# Patient Record
Sex: Female | Born: 1990 | Race: Black or African American | Hispanic: No | Marital: Single | Smoking: Never smoker
Health system: Southern US, Community
[De-identification: ages and names within clinical notes are randomized; demographics above are authoritative.]

---

## 2016-03-16 ENCOUNTER — Encounter (HOSPITAL_COMMUNITY): Payer: Self-pay | Admitting: Emergency Medicine

## 2016-03-16 ENCOUNTER — Emergency Department (HOSPITAL_COMMUNITY)
Admission: EM | Admit: 2016-03-16 | Discharge: 2016-03-16 | Disposition: A | Discharge status: Home / Self Care | Payer: Medicaid Other | Attending: Emergency Medicine | Admitting: Emergency Medicine

## 2016-03-16 DIAGNOSIS — T490X5A Adverse effect of local antifungal, anti-infective and anti-inflammatory drugs, initial encounter: Secondary | ICD-10-CM | POA: Insufficient documentation

## 2016-03-16 DIAGNOSIS — R1909 Other intra-abdominal and pelvic swelling, mass and lump: Secondary | ICD-10-CM | POA: Diagnosis present

## 2016-03-16 DIAGNOSIS — L233 Allergic contact dermatitis due to drugs in contact with skin: Secondary | ICD-10-CM | POA: Insufficient documentation

## 2016-03-16 MED ORDER — PREDNISONE 50 MG PO TABS: ORAL_TABLET | ORAL | 0 refills | Status: AC

## 2016-03-16 NOTE — ED Triage Notes (Signed)
Pt complaint of vaginal/genitalia swelling post home treatment for yeast infection 3 days ago.

## 2016-03-16 NOTE — ED Provider Notes (Signed)
[facility]-EMERGENCY DEPT Provider Note   CSN: [id number] Arrival date & time: 03/16/16  [id number]     History   Chief Complaint Chief Complaint  Patient presents with  . Groin Swelling    HPI Stacie Hamilton is a 26 y.o. female.  26 year old female presents with labial irritation after using Monistat. States that she has not had any vaginal discharge or bleeding. Denies any dysuria or hematuria. No lower abdominal discomfort. She is not concerned about STDs. States that her yeast infection has improved but now she has discomfort when she ambulates. Denies any pruritus. Has not used anything prior to arrival.      History reviewed. No pertinent past medical history.  There are no active problems to display for this patient.   Past Surgical History:  Procedure Laterality Date  . CESAREAN SECTION      OB History    No data available       Home Medications    Prior to Admission medications   Not on File    Family History No family history on file.  Social History Social History  Substance Use Topics  . Smoking status: Never Smoker  . Smokeless tobacco: Not on file  . Alcohol use No     Allergies   Patient has no known allergies.   Review of Systems Review of Systems  All other systems reviewed and are negative.    Physical Exam Updated Vital Signs BP 129/90 (BP Location: Right Arm)   Pulse 77   Temp 98.2 F (36.8 C) (Oral)   Resp 14   Ht 5\' 1"  (1.549 m)   Wt 70.3 kg   LMP 02/23/2016   SpO2 100%   BMI 29.29 kg/m   Physical Exam  Constitutional: She is oriented to person, place, and time. She appears well-developed and well-nourished.  Non-toxic appearance. No distress.  HENT:  Head: Normocephalic and atraumatic.  Eyes: Conjunctivae, EOM and lids are normal. Pupils are equal, round, and reactive to light.  Neck: Normal range of motion. Neck supple. No tracheal deviation present. No thyroid mass present.  Cardiovascular: Normal rate, regular  rhythm and normal heart sounds.  Exam reveals no gallop.   No murmur heard. Pulmonary/Chest: Effort normal and breath sounds normal. No stridor. No respiratory distress. She has no decreased breath sounds. She has no wheezes. She has no rhonchi. She has no rales.  Abdominal: Soft. Normal appearance and bowel sounds are normal. She exhibits no distension. There is no tenderness. There is no rebound and no CVA tenderness.  Genitourinary: There is no rash, lesion or injury on the right labia. There is no rash, lesion or injury on the left labia.  Musculoskeletal: Normal range of motion. She exhibits no edema or tenderness.  Neurological: She is alert and oriented to person, place, and time. She has normal strength. No cranial nerve deficit or sensory deficit. GCS eye subscore is 4. GCS verbal subscore is 5. GCS motor subscore is 6.  Skin: Skin is warm and dry. No abrasion and no rash noted.  Psychiatric: She has a normal mood and affect. Her speech is normal and behavior is normal.  Nursing note and vitals reviewed.    ED Treatments / Results  Labs (all labs ordered are listed, but only abnormal results are displayed) Labs Reviewed - No data to display  EKG  EKG Interpretation None       Radiology No results found.  Procedures Procedures (including critical care time)  Medications Ordered in  ED Medications - No data to display   Initial Impression / Assessment and Plan / ED Course  I have reviewed the triage vital signs and the nursing notes.  Pertinent labs & imaging results that were available during my care of the patient were reviewed by me and considered in my medical decision making (see chart for details).  Clinical Course     Patient has deferred a vaginal exam at this time. Does have some slight edema to her labia. Acute course of prednisone and return precautions given  Final Clinical Impressions(s) / ED Diagnoses   Final diagnoses:  None    New  Prescriptions New Prescriptions   No medications on file     Lorre Nick, MD 03/16/16 1040

## 2016-03-16 NOTE — Discharge Instructions (Signed)
Use Benadryl if you develop any itching.

## 2016-05-11 ENCOUNTER — Ambulatory Visit (HOSPITAL_COMMUNITY)
Admission: EM | Admit: 2016-05-11 | Discharge: 2016-05-11 | Disposition: A | Discharge status: Home / Self Care | Payer: Self-pay | Attending: Internal Medicine | Admitting: Internal Medicine

## 2016-05-11 ENCOUNTER — Encounter (HOSPITAL_COMMUNITY): Payer: Self-pay | Admitting: Emergency Medicine

## 2016-05-11 DIAGNOSIS — N750 Cyst of Bartholin's gland: Secondary | ICD-10-CM

## 2016-05-11 LAB — POCT URINALYSIS DIP (DEVICE)
Bilirubin Urine: NEGATIVE
Glucose, UA: NEGATIVE mg/dL
KETONES UR: NEGATIVE mg/dL
Leukocytes, UA: NEGATIVE
Nitrite: NEGATIVE
PH: 7 (ref 5.0–8.0)
PROTEIN: NEGATIVE mg/dL
SPECIFIC GRAVITY, URINE: 1.01 (ref 1.005–1.030)
Urobilinogen, UA: 0.2 mg/dL (ref 0.0–1.0)

## 2016-05-11 MED ORDER — HYDROCODONE-ACETAMINOPHEN 5-325 MG PO TABS: 1.0000 | ORAL_TABLET | ORAL | 0 refills | Status: AC | PRN

## 2016-05-11 MED ORDER — LIDOCAINE-EPINEPHRINE (PF) 2 %-1:200000 IJ SOLN
INTRAMUSCULAR | Status: AC
  Filled 2016-05-11: qty 20

## 2016-05-11 NOTE — ED Triage Notes (Signed)
PT reports burning with urination that started today. PT also reports left side vaginal swelling that started Wednesday. PT denies vaginal discharge. PT reports menstrual cycle started today.

## 2016-05-11 NOTE — Discharge Instructions (Signed)
This was an infected cyst full of pus and what is called a Bartholin's gland. This gland was drained and packing was placed. Warm compresses may help. There may be some bleeding. Distal some pressure to the dressing or you may even change the dressing or add more to it. Try to keep it is dry as possible. Try to keep the packing in if possible. Return in 2 days.

## 2016-05-11 NOTE — ED Provider Notes (Signed)
CSN: [id number]     Arrival date & time 05/11/16  1717 History   First MD Initiated Contact with Patient 05/11/16 1821     Chief Complaint  Patient presents with  . Urinary Tract Infection   (Consider location/radiation/quality/duration/timing/severity/associated sxs/prior Treatment) 26 year old female states that she has a swollen gland at the outside of her vagina this started 3 days ago and is getting larger. She started to have some dysuria today she describes more of an irritation. No urinary frequency or urgency.      History reviewed. No pertinent past medical history. Past Surgical History:  Procedure Laterality Date  . CESAREAN SECTION     No family history on file. Social History  Substance Use Topics  . Smoking status: Never Smoker  . Smokeless tobacco: Never Used  . Alcohol use No   OB History    No data available     Review of Systems  Constitutional: Negative.   Genitourinary: Positive for dysuria and genital sores. Negative for flank pain, frequency and urgency.  Skin: Negative.   Neurological: Negative.   All other systems reviewed and are negative.   Allergies  Patient has no known allergies.  Home Medications   Prior to Admission medications   Medication Sig Start Date End Date Taking? Authorizing Provider  predniSONE (DELTASONE) 50 MG tablet One by mouth daily 5 days 03/16/16   Lorre Nick, MD   Meds Ordered and Administered this Visit  Medications - No data to display  BP 140/82 (BP Location: Left Arm)   Pulse 97   Temp 98.3 F (36.8 C) (Oral)   Resp 14   Ht 5\' 1"  (1.549 m)   Wt 155 lb (70.3 kg)   LMP 05/11/2016   SpO2 98%   BMI 29.29 kg/m  No data found.   Physical Exam  Constitutional: She is oriented to person, place, and time. She appears well-developed and well-nourished. No distress.  Eyes: EOM are normal.  Neck: Normal range of motion. Neck supple.  Cardiovascular: Normal rate.   Pulmonary/Chest: Effort normal. No  respiratory distress.  Genitourinary:  Genitourinary Comments: External female genitalia with left bulging cystic lesion consistent with Bartholin's gland cyst. Otherwise no evidence of vaginal discharge. Urethra is without erythema or swelling. No other erythema is seen. No cellulitis.  Musculoskeletal: She exhibits no edema.  Neurological: She is alert and oriented to person, place, and time. She exhibits normal muscle tone.  Skin: Skin is warm and dry.  Psychiatric: She has a normal mood and affect.  Nursing note and vitals reviewed.   Urgent Care Course   assistance from [place], [place] The Valentino Saxon gland was prepped with Betadine. A small amount of 2% lidocaine with epinephrine was injected to the outer lining of the cyst. During this procedure some pus starts draining. The wound was then punctured with an 11 blade and a copious amount of drainage was captured. Additional manipulation further expressed purulence and some blood. Packing, 1/4 inch was placed within the wound and a dressing covering the wound.  Procedures (including critical care time)  Labs Review Labs Reviewed  POCT URINALYSIS DIP (DEVICE) - Abnormal; Notable for the following:       Result Value   Hgb urine dipstick TRACE (*)    All other components within normal limits    Imaging Review No results found.   Visual Acuity Review  Right Eye Distance:   Left Eye Distance:   Bilateral Distance:    Right Eye Near:  Left Eye Near:    Bilateral Near:         MDM   1. Bartholin gland cyst    This was an infected cyst full of pus and what is called a Bartholin's gland. This gland was drained and packing was placed. Warm compresses may help. There may be some bleeding. Distal some pressure to the dressing or you may even change the dressing or add more to it. Try to keep it is dry as possible. Try to keep the packing in if possible. Return in 2 days. Meds ordered this encounter  Medications  .  HYDROcodone-acetaminophen (NORCO/VICODIN) 5-325 MG tablet    Sig: Take 1 tablet by mouth every 4 (four) hours as needed.    Dispense:  15 tablet    Refill:  0    Order Specific Question:   Supervising Provider    Answer:   Eustace MooreMURRAY, LAURA W [[id number]]       Hayden Rasmussenavid Shalimar Mcclain, NP 05/11/16 [phone]

## 2016-05-12 ENCOUNTER — Emergency Department (HOSPITAL_COMMUNITY)
Admission: EM | Admit: 2016-05-12 | Discharge: 2016-05-12 | Disposition: A | Discharge status: Home / Self Care | Payer: Medicaid Other | Attending: Emergency Medicine | Admitting: Emergency Medicine

## 2016-05-12 DIAGNOSIS — N751 Abscess of Bartholin's gland: Secondary | ICD-10-CM | POA: Diagnosis not present

## 2016-05-12 DIAGNOSIS — Z79899 Other long term (current) drug therapy: Secondary | ICD-10-CM | POA: Diagnosis not present

## 2016-05-12 LAB — I-STAT CHEM 8, ED
BUN: 10 mg/dL (ref 6–20)
CALCIUM ION: 1.11 mmol/L — AB (ref 1.15–1.40)
CHLORIDE: 105 mmol/L (ref 101–111)
Creatinine, Ser: 0.7 mg/dL (ref 0.44–1.00)
GLUCOSE: 118 mg/dL — AB (ref 65–99)
HCT: 31 % — ABNORMAL LOW (ref 36.0–46.0)
Hemoglobin: 10.5 g/dL — ABNORMAL LOW (ref 12.0–15.0)
POTASSIUM: 3.8 mmol/L (ref 3.5–5.1)
Sodium: 139 mmol/L (ref 135–145)
TCO2: 26 mmol/L (ref 0–100)

## 2016-05-12 MED ORDER — SODIUM CHLORIDE 0.9 % IV BOLUS (SEPSIS)
1000.0000 mL | Freq: Once | INTRAVENOUS | Status: AC
  Administered 2016-05-12: 1000 mL via INTRAVENOUS

## 2016-05-12 MED ORDER — ACETAMINOPHEN 500 MG PO TABS
1000.0000 mg | ORAL_TABLET | Freq: Once | ORAL | Status: AC
  Administered 2016-05-12: 1000 mg via ORAL
  Filled 2016-05-12: qty 2

## 2016-05-12 MED ORDER — LIDOCAINE-EPINEPHRINE (PF) 2 %-1:200000 IJ SOLN
20.0000 mL | Freq: Once | INTRAMUSCULAR | Status: AC
  Administered 2016-05-12: 20 mL
  Filled 2016-05-12: qty 20

## 2016-05-12 MED ORDER — SODIUM CHLORIDE 0.9 % IV BOLUS (SEPSIS)
1000.0000 mL | Freq: Once | INTRAVENOUS | Status: AC
Start: 2016-05-12 — End: 2016-05-12
  Administered 2016-05-12: 1000 mL via INTRAVENOUS

## 2016-05-12 NOTE — ED Provider Notes (Signed)
[facility] DEPT Provider Note   CSN: [id number] Arrival date & time: 05/12/16  0600     History   Chief Complaint Chief Complaint  Patient presents with  . Abscess  . Bleeding/Bruising    HPI Stacie Hamilton is a 26 y.o. female.  HPI Patient presents to the emergency department with continued bleeding from an area that was opened and drained in the Bartholin's region.  The patient states she was seen at urgent care and had a Bartholin's cyst drained with packing placed.  The patient states back and came out last night.  Patient states that the areas continue to bleed pretty heavily since yesterday.  Patient denies any nausea, vomiting, headache, blurred vision, chest pain, shortness of breath, abdominal pain or syncope.  The patient states she does have little bit of dizziness.  She is currently on her menstrual cycle as well No past medical history on file.  There are no active problems to display for this patient.   Past Surgical History:  Procedure Laterality Date  . CESAREAN SECTION      OB History    No data available       Home Medications    Prior to Admission medications   Medication Sig Start Date End Date Taking? Authorizing Provider  HYDROcodone-acetaminophen (NORCO/VICODIN) 5-325 MG tablet Take 1 tablet by mouth every 4 (four) hours as needed. Patient not taking: Reported on 05/12/2016 05/11/16   Hayden Rasmussen, NP  predniSONE (DELTASONE) 50 MG tablet One by mouth daily 5 days Patient not taking: Reported on 05/12/2016 03/16/16   Lorre Nick, MD    Family History No family history on file.  Social History Social History  Substance Use Topics  . Smoking status: Never Smoker  . Smokeless tobacco: Never Used  . Alcohol use No     Allergies   Patient has no known allergies.   Review of Systems Review of Systems All other systems negative except as documented in the HPI. All pertinent positives and negatives as reviewed in the HPI.  Physical  Exam Updated Vital Signs BP 121/87 (BP Location: Left Arm)   Pulse 93   Temp 98.6 F (37 C) (Oral)   Resp 13   Ht 5\' 1"  (1.549 m)   Wt 70.3 kg   LMP 05/11/2016   SpO2 100%   BMI 29.29 kg/m   Physical Exam  Constitutional: She is oriented to person, place, and time. She appears well-developed and well-nourished. No distress.  HENT:  Head: Normocephalic and atraumatic.  Eyes: Pupils are equal, round, and reactive to light.  Pulmonary/Chest: Effort normal.  Genitourinary:     Genitourinary Comments: Chaperone was present during all examinations of the genitalia area.  The nurse and tech assisted in this  Neurological: She is alert and oriented to person, place, and time.  Skin: Skin is warm and dry.  Psychiatric: She has a normal mood and affect.  Nursing note and vitals reviewed.    ED Treatments / Results  Labs (all labs ordered are listed, but only abnormal results are displayed) Labs Reviewed  I-STAT CHEM 8, ED - Abnormal; Notable for the following:       Result Value   Glucose, Bld 118 (*)    Calcium, Ion 1.11 (*)    Hemoglobin 10.5 (*)    HCT 31.0 (*)    All other components within normal limits    EKG  EKG Interpretation None       Radiology No results found.  Procedures Procedures (including critical care time)  Medications Ordered in ED Medications  lidocaine-EPINEPHrine (XYLOCAINE W/EPI) 2 %-1:200000 (PF) injection 20 mL (not administered)  acetaminophen (TYLENOL) tablet 1,000 mg (1,000 mg Oral Given 05/12/16 0748)  sodium chloride 0.9 % bolus 1,000 mL (1,000 mLs Intravenous New Bag/Given 05/12/16 0846)  sodium chloride 0.9 % bolus 1,000 mL (1,000 mLs Intravenous New Bag/Given 05/12/16 0846)     Initial Impression / Assessment and Plan / ED Course  I have reviewed the triage vital signs and the nursing notes.  Pertinent labs & imaging results that were available during my care of the patient were reviewed by me and considered in my medical  decision making (see chart for details).     I did anesthetize the area with 2% with epinephrine, lidocaine and made the incision site.  Larger to about 4 mm the previous incision site was a small puncture to the area.  I did place a Word catheter and gave care instructions for this to the patient also advised her this would need to stay in place for the next 3-4 weeks and we [facility] and she does not have a doctor or insurance.  The patient is agreeable to this plan.  I advised her that a Word catheter can come out on its own, but we would attempt to leave it in place until she follows up.  I advised her to return here as needed.  At the end of the procedure there was no noticeable significant bleeding.  Patient is advised to keep the area clean and dry.   Final Clinical Impressions(s) / ED Diagnoses   Final diagnoses:  None    New Prescriptions New Prescriptions   No medications on file     Charlestine NightChristopher Shem Plemmons, PA-C 05/12/16 1007    Lyndal Pulleyaniel Knott, MD 05/12/16 [phone]

## 2016-05-12 NOTE — ED Notes (Signed)
Pt reports having bleeding from vaginal abscess that was drained on 05/11/16. Pt states that bleeding began around 2030 and clots noted by pt. Pt states that largest clot was size of golf ball. On external evaluation bleeding noted from vagina and clot protruding from vagina.

## 2016-05-12 NOTE — ED Notes (Signed)
Pt was seen by urgent care today at 1830 for an abscess where it was cleaned and packed with gauze. Pt is now presenting with bleeding from the abscess.

## 2016-05-12 NOTE — ED Notes (Addendum)
Pt has large amount of blood onto towel and sts that she has had large amount of bleeding with clots since she had a genital  abscess lanced yesterday at urgent care. Pt denies pain but confirms "discomfort".  Pt adds that she is also on her period. Thayer Ohmhris, [place] at bedside

## 2016-05-12 NOTE — Discharge Instructions (Signed)
Return here as needed.  Keep the area clean and dry.  He can use warm compresses around the area.  [facility] by calling them first thing tomorrow morning for an appointment in the next 3-4 weeks for removal and reevaluation of the Word catheter placement

## 2016-05-12 NOTE — ED Notes (Addendum)
-  Pt c/o bleeding of abscess on exterior vagina . Pt seen at urgent care yesterday when abscess was drained and packed. Pt has gone through 3 pads with the blood. Pt endorses lightheadedness since the bleeding increased.

## 2016-07-05 ENCOUNTER — Encounter (HOSPITAL_COMMUNITY): Payer: Self-pay | Admitting: Emergency Medicine

## 2016-07-05 ENCOUNTER — Emergency Department (HOSPITAL_COMMUNITY): Payer: Self-pay

## 2016-07-05 ENCOUNTER — Emergency Department (HOSPITAL_COMMUNITY)
Admission: EM | Admit: 2016-07-05 | Discharge: 2016-07-05 | Disposition: A | Discharge status: Home / Self Care | Payer: Self-pay | Attending: Emergency Medicine | Admitting: Emergency Medicine

## 2016-07-05 DIAGNOSIS — G43809 Other migraine, not intractable, without status migrainosus: Secondary | ICD-10-CM | POA: Insufficient documentation

## 2016-07-05 DIAGNOSIS — Z79899 Other long term (current) drug therapy: Secondary | ICD-10-CM | POA: Insufficient documentation

## 2016-07-05 DIAGNOSIS — R9431 Abnormal electrocardiogram [ECG] [EKG]: Secondary | ICD-10-CM | POA: Insufficient documentation

## 2016-07-05 LAB — BASIC METABOLIC PANEL
ANION GAP: 9 (ref 5–15)
BUN: 12 mg/dL (ref 6–20)
CO2: 23 mmol/L (ref 22–32)
Calcium: 9.2 mg/dL (ref 8.9–10.3)
Chloride: 103 mmol/L (ref 101–111)
Creatinine, Ser: 0.79 mg/dL (ref 0.44–1.00)
GFR calc Af Amer: >60 mL/min (ref >60)
Glucose, Bld: 117 mg/dL — ABNORMAL HIGH (ref 65–99)
POTASSIUM: 3.3 mmol/L — AB (ref 3.5–5.1)
SODIUM: 135 mmol/L (ref 135–145)

## 2016-07-05 LAB — I-STAT TROPONIN, ED: Troponin i, poc: 0 ng/mL (ref 0.00–0.08)

## 2016-07-05 LAB — CBC
HEMATOCRIT: 30.1 % — AB (ref 36.0–46.0)
HEMOGLOBIN: 9.5 g/dL — AB (ref 12.0–15.0)
MCH: 25.5 pg — ABNORMAL LOW (ref 26.0–34.0)
MCHC: 31.6 g/dL (ref 30.0–36.0)
MCV: 80.9 fL (ref 78.0–100.0)
Platelets: 473 10*3/uL — ABNORMAL HIGH (ref 150–400)
RBC: 3.72 MIL/uL — AB (ref 3.87–5.11)
RDW: 14.9 % (ref 11.5–15.5)
WBC: 8.9 10*3/uL (ref 4.0–10.5)

## 2016-07-05 MED ORDER — KETOROLAC TROMETHAMINE 30 MG/ML IJ SOLN
15.0000 mg | Freq: Once | INTRAMUSCULAR | Status: AC
  Administered 2016-07-05: 15 mg via INTRAVENOUS
  Filled 2016-07-05: qty 1

## 2016-07-05 MED ORDER — DIPHENHYDRAMINE HCL 50 MG/ML IJ SOLN
12.5000 mg | Freq: Once | INTRAMUSCULAR | Status: AC
  Administered 2016-07-05: 12.5 mg via INTRAVENOUS
  Filled 2016-07-05: qty 1

## 2016-07-05 MED ORDER — METOCLOPRAMIDE HCL 5 MG/ML IJ SOLN
5.0000 mg | Freq: Once | INTRAMUSCULAR | Status: AC
  Administered 2016-07-05: 5 mg via INTRAVENOUS
  Filled 2016-07-05: qty 2

## 2016-07-05 NOTE — ED Provider Notes (Signed)
[facility] Provider Note   CSN: [id number] Arrival date & time: 07/05/16  0830     History   Chief Complaint Chief Complaint  Patient presents with  . Back Pain  . Chest Pain  . Emesis    HPI Stacie Hamilton is a 26 y.o. female.  26 year old who presents complaining of left-sided headache with blurred vision which then developed into bilateral neck pain without photophobia or fever. She also notes thoracic and lumbar paraspinal pain characterized as sharp and worse with movement. States that she had emesis 1 yesterday but has not this time. Has also had sharp chest pain under both breasts without pleurisy which is been constant 1 day. No associated leg pain or swelling. Patient used NSAIDs with significant relief. Denies any recent history of trauma. No urinary symptoms. Denies any chance of pregnancy.      History reviewed. No pertinent past medical history.  There are no active problems to display for this patient.   Past Surgical History:  Procedure Laterality Date  . CESAREAN SECTION      OB History    No data available       Home Medications    Prior to Admission medications   Medication Sig Start Date End Date Taking? Authorizing Provider  HYDROcodone-acetaminophen (NORCO/VICODIN) 5-325 MG tablet Take 1 tablet by mouth every 4 (four) hours as needed. Patient not taking: Reported on 05/12/2016 05/11/16   Hayden Rasmussen, NP  predniSONE (DELTASONE) 50 MG tablet One by mouth daily 5 days Patient not taking: Reported on 05/12/2016 03/16/16   Lorre Nick, MD    Family History No family history on file.  Social History Social History  Substance Use Topics  . Smoking status: Never Smoker  . Smokeless tobacco: Never Used  . Alcohol use No     Allergies   Patient has no known allergies.   Review of Systems Review of Systems  All other systems reviewed and are negative.    Physical Exam Updated Vital Signs BP 138/86 (BP Location: Right Arm)    Pulse 75   Temp 98.5 F (36.9 C) (Oral)   Resp 16   LMP 06/22/2016 (Exact Date)   SpO2 99%   Physical Exam  Constitutional: She is oriented to person, place, and time. She appears well-developed and well-nourished.  Non-toxic appearance. No distress.  HENT:  Head: Normocephalic and atraumatic.  Eyes: Conjunctivae, EOM and lids are normal. Pupils are equal, round, and reactive to light.  Neck: Normal range of motion. Neck supple. No tracheal deviation present. No thyroid mass present.  Cardiovascular: Normal rate, regular rhythm and normal heart sounds.  Exam reveals no gallop.   No murmur heard. Pulmonary/Chest: Effort normal and breath sounds normal. No stridor. No respiratory distress. She has no decreased breath sounds. She has no wheezes. She has no rhonchi. She has no rales.  Abdominal: Soft. Normal appearance and bowel sounds are normal. She exhibits no distension. There is no tenderness. There is no rebound and no CVA tenderness.  Musculoskeletal: Normal range of motion. She exhibits no edema or tenderness.       Arms: Neurological: She is alert and oriented to person, place, and time. She has normal strength. No cranial nerve deficit or sensory deficit. GCS eye subscore is 4. GCS verbal subscore is 5. GCS motor subscore is 6.  Skin: Skin is warm and dry. No abrasion and no rash noted.  Psychiatric: She has a normal mood and affect. Her speech is normal and  behavior is normal.  Nursing note and vitals reviewed.    ED Treatments / Results  Labs (all labs ordered are listed, but only abnormal results are displayed) Labs Reviewed  BASIC METABOLIC PANEL  CBC  I-STAT TROPOININ, ED    EKG  EKG Interpretation  Date/Time:  Friday July 05 2016 08:51:50 EDT Ventricular Rate:  67 PR Interval:    QRS Duration: 80 QT Interval:  371 QTC Calculation: 392 R Axis:   56 Text Interpretation:  Sinus rhythm Borderline T abnormalities, inferior leads Confirmed by Freida Busman  MD, Denasia Venn  ([id number]) on 07/05/2016 9:00:44 AM Also confirmed by Freida Busman  MD, Graceyn Fodor ([id number]), editor [place] CT, Jola Babinski [phone])  on 07/05/2016 9:04:23 AM       Radiology No results found.  Procedures Procedures (including critical care time)  Medications Ordered in ED Medications  metoCLOPramide (REGLAN) injection 5 mg (not administered)  diphenhydrAMINE (BENADRYL) injection 12.5 mg (not administered)  ketorolac (TORADOL) 30 MG/ML injection 15 mg (not administered)     Initial Impression / Assessment and Plan / ED Course  I have reviewed the triage vital signs and the nursing notes.  Pertinent labs & imaging results that were available during my care of the patient were reviewed by me and considered in my medical decision making (see chart for details).     Patient with headache likely from a migraine. She has no nuchal rigidity. Do not think this represents meningitis. She has been afebrile. They chest discomfort which likely is chest wall in nature and for pe. she has negative chest x-ray as well as troponin. her ekg is abnormal however does not show any signs of acute ischemia. feels better after being medicated here in stable for discharge Final Clinical Impressions(s) / ED Diagnoses   Final diagnoses:  None    New Prescriptions New Prescriptions   No medications on file     Lorre Nick, MD 07/05/16 1048

## 2016-07-05 NOTE — ED Triage Notes (Signed)
Pt from home c/o back pain x 2 days with emesis and chest pain. Pt sts that vomiting was yesterday with none today. Pt adds that she had neck pain that makes it difficult to turn her head. Pt reports chest hurts "underneath my breast" and is intermittent. Pt is A&O, ambulatory and in NAD

## 2016-07-05 NOTE — Discharge Instructions (Signed)
Your EKG today had some abnormalities that will require cardiology follow-up. Return here if she should develop dizziness, severe chest pressure, or any other problems.

## 2016-07-15 ENCOUNTER — Emergency Department (HOSPITAL_COMMUNITY): Payer: Self-pay

## 2016-07-15 ENCOUNTER — Emergency Department (HOSPITAL_COMMUNITY)
Admission: EM | Admit: 2016-07-15 | Discharge: 2016-07-16 | Disposition: A | Discharge status: Home / Self Care | Payer: Self-pay | Attending: Emergency Medicine | Admitting: Emergency Medicine

## 2016-07-15 ENCOUNTER — Encounter (HOSPITAL_COMMUNITY): Payer: Self-pay | Admitting: Emergency Medicine

## 2016-07-15 DIAGNOSIS — R519 Headache, unspecified: Secondary | ICD-10-CM

## 2016-07-15 DIAGNOSIS — R51 Headache: Secondary | ICD-10-CM

## 2016-07-15 DIAGNOSIS — Z79899 Other long term (current) drug therapy: Secondary | ICD-10-CM | POA: Insufficient documentation

## 2016-07-15 DIAGNOSIS — G932 Benign intracranial hypertension: Secondary | ICD-10-CM | POA: Insufficient documentation

## 2016-07-15 DIAGNOSIS — Z7982 Long term (current) use of aspirin: Secondary | ICD-10-CM | POA: Insufficient documentation

## 2016-07-15 LAB — CBC WITH DIFFERENTIAL/PLATELET
BASOS ABS: 0.1 10*3/uL (ref 0.0–0.1)
Basophils Relative: 1 %
EOS PCT: 2 %
Eosinophils Absolute: 0.2 10*3/uL (ref 0.0–0.7)
HEMATOCRIT: 32.1 % — AB (ref 36.0–46.0)
Hemoglobin: 10 g/dL — ABNORMAL LOW (ref 12.0–15.0)
LYMPHS PCT: 36 %
Lymphs Abs: 3.6 10*3/uL (ref 0.7–4.0)
MCH: 24.8 pg — ABNORMAL LOW (ref 26.0–34.0)
MCHC: 31.2 g/dL (ref 30.0–36.0)
MCV: 79.7 fL (ref 78.0–100.0)
Monocytes Absolute: 0.6 10*3/uL (ref 0.1–1.0)
Monocytes Relative: 6 %
NEUTROS ABS: 5.7 10*3/uL (ref 1.7–7.7)
Neutrophils Relative %: 55 %
PLATELETS: 455 10*3/uL — AB (ref 150–400)
RBC: 4.03 MIL/uL (ref 3.87–5.11)
RDW: 15.1 % (ref 11.5–15.5)
WBC: 10.1 10*3/uL (ref 4.0–10.5)

## 2016-07-15 LAB — BASIC METABOLIC PANEL
ANION GAP: 6 (ref 5–15)
BUN: 8 mg/dL (ref 6–20)
CALCIUM: 9.5 mg/dL (ref 8.9–10.3)
CO2: 25 mmol/L (ref 22–32)
Chloride: 105 mmol/L (ref 101–111)
Creatinine, Ser: 0.74 mg/dL (ref 0.44–1.00)
GLUCOSE: 92 mg/dL (ref 65–99)
POTASSIUM: 3.7 mmol/L (ref 3.5–5.1)
Sodium: 136 mmol/L (ref 135–145)

## 2016-07-15 LAB — CSF CELL COUNT WITH DIFFERENTIAL
RBC Count, CSF: 1 /mm3 — ABNORMAL HIGH
RBC Count, CSF: 120 /mm3 — ABNORMAL HIGH
Tube #: 1
Tube #: 4
WBC CSF: 0 /mm3 (ref 0–5)
WBC, CSF: 4 /mm3 (ref 0–5)

## 2016-07-15 LAB — PROTEIN AND GLUCOSE, CSF
Glucose, CSF: 56 mg/dL (ref 40–70)
TOTAL PROTEIN, CSF: 16 mg/dL (ref 15–45)

## 2016-07-15 LAB — I-STAT BETA HCG BLOOD, ED (MC, WL, AP ONLY)

## 2016-07-15 LAB — POC URINE PREG, ED: PREG TEST UR: NEGATIVE

## 2016-07-15 MED ORDER — LIDOCAINE-EPINEPHRINE (PF) 2 %-1:200000 IJ SOLN
INTRAMUSCULAR | Status: AC
  Filled 2016-07-15: qty 20

## 2016-07-15 MED ORDER — LIDOCAINE-EPINEPHRINE 2 %-1:100000 IJ SOLN
20.0000 mL | Freq: Once | INTRAMUSCULAR | Status: DC
  Filled 2016-07-15: qty 20

## 2016-07-15 MED ORDER — ACETAZOLAMIDE ER 500 MG PO CP12: 500.0000 mg | ORAL_CAPSULE | Freq: Two times a day (BID) | ORAL | 0 refills | Status: AC

## 2016-07-15 MED ORDER — GADOBENATE DIMEGLUMINE 529 MG/ML IV SOLN
13.0000 mL | Freq: Once | INTRAVENOUS | Status: AC | PRN
  Administered 2016-07-15: 13 mL via INTRAVENOUS

## 2016-07-15 NOTE — ED Notes (Signed)
Bed: [id number] Expected date:  Expected time:  Means of arrival:  Comments: Stacie Hamilton

## 2016-07-15 NOTE — ED Notes (Signed)
Patient transported to CT 

## 2016-07-15 NOTE — ED Notes (Signed)
Date and time results received: 07/15/16  2114 (use smartphrase ".now" to insert current time)  Test: Spinal fluid Critical Value: No organisms and no WBCs  Name of Provider Notified: Dr. Eudelia Bunchardama  Orders Received? Or Actions Taken?: EDP notified

## 2016-07-15 NOTE — ED Provider Notes (Signed)
[facility]-EMERGENCY DEPT Provider Note   CSN: [id number] Arrival date & time: 07/15/16  1516     History   Chief Complaint Chief Complaint  Patient presents with  . Optic Nerve Compression  . Sent by Eye Dr.    Sula Rumple [name] is a 26 y.o. female.  The history is provided by the patient.  Headache   This is a new problem. Episode onset: 1 week. The problem has not changed since onset.The headache is associated with nothing. Pain location: generalized. The pain is moderate. The pain does not radiate. Associated symptoms comments: Left eye blurriness . Treatments tried: Over-the-counter medicine. The treatment provided no relief.   Patient was seen by ophthalmology today and noted that the patient had left papilledema. Visual acuity at was 20/25 bilaterally and intraocular pressures were within normal limits.  Sent here for further evaluation and assessment to rule out mass effect, venous sinus thrombosis or pseudotumor cerebri.  History reviewed. No pertinent past medical history.  There are no active problems to display for this patient.   Past Surgical History:  Procedure Laterality Date  . CESAREAN SECTION      OB History    No data available       Home Medications    Prior to Admission medications   Medication Sig Start Date End Date Taking? Authorizing Provider  aspirin-acetaminophen-caffeine (EXCEDRIN MIGRAINE) [phone] MG tablet Take 2 tablets by mouth every 6 (six) hours as needed for headache.   Yes [provider]  acetaZOLAMIDE (DIAMOX SEQUELS) 500 MG capsule Take 1 capsule (500 mg total) by mouth 2 (two) times daily. 07/15/16 08/14/16  Nira Conn, MD  HYDROcodone-acetaminophen (NORCO/VICODIN) 5-325 MG tablet Take 1 tablet by mouth every 4 (four) hours as needed. Patient not taking: Reported on 05/12/2016 05/11/16   Hayden Rasmussen, NP  predniSONE (DELTASONE) 50 MG tablet One by mouth daily 5 days Patient not taking: Reported on 05/12/2016  03/16/16   Lorre Nick, MD    Family History No family history on file.  Social History Social History  Substance Use Topics  . Smoking status: Never Smoker  . Smokeless tobacco: Never Used  . Alcohol use No     Allergies   Patient has no known allergies.   Review of Systems Review of Systems  Eyes: Positive for visual disturbance.  Neurological: Positive for headaches.  All other systems are reviewed and are negative for acute change except as noted in the HPI    Physical Exam Updated Vital Signs BP (!) 145/108 (BP Location: Left Arm)   Pulse 88   Temp 99.4 F (37.4 C) (Oral)   Resp 15   Ht 5\' 1"  (1.549 m)   Wt 145 lb (65.8 kg)   LMP 06/22/2016 (Exact Date)   SpO2 100%   BMI 27.40 kg/m   Physical Exam  Constitutional: She is oriented to person, place, and time. She appears well-developed and well-nourished. No distress.  HENT:  Head: Normocephalic and atraumatic.  Nose: Nose normal.  Eyes: Conjunctivae and EOM are normal. Right eye exhibits no discharge. Left eye exhibits no discharge. No scleral icterus.  Fundoscopic exam:      The right eye shows papilledema (mild).       The left eye shows papilledema (significant).  Neck: Normal range of motion. Neck supple.  Cardiovascular: Normal rate and regular rhythm.  Exam reveals no gallop and no friction rub.   No murmur heard. Pulmonary/Chest: Effort normal and breath sounds normal. No stridor.  No respiratory distress. She has no rales.  Abdominal: Soft. She exhibits no distension. There is no tenderness.  Musculoskeletal: She exhibits no edema or tenderness.  Neurological: She is alert and oriented to person, place, and time.  Mental Status: Alert and oriented to person, place, and time. Attention and concentration normal. Speech clear. Recent memory is intac  Cranial Nerves  II Visual Fields: Intact to confrontation. Visual fields intact. III, IV, VI: Pupils equal and reactive to light and near. Full eye  movement without nystagmus  V Facial Sensation: Normal. No weakness of masticatory muscles  VII: No facial weakness or asymmetry  VIII Auditory Acuity: Grossly normal  IX/X: The uvula is midline; the palate elevates symmetrically  XI: Normal sternocleidomastoid and trapezius strength  XII: The tongue is midline. No atrophy or fasciculations.   Motor System: Muscle Strength: 5/5 and symmetric in the upper and lower extremities. No pronation or drift.  Muscle Tone: Tone and muscle bulk are normal in the upper and lower extremities.   Reflexes: DTRs: 2+ and symmetrical in all four extremities. Plantar responses are flexor bilaterally.  Coordination: Intact finger-to-nose, heel-to-shin, and rapid alternating movements. No tremor.  Sensation: Intact to light touch, and pinprick. Negative Romberg test.  Gait: Routine and tandem gait are normal    Skin: Skin is warm and dry. No rash noted. She is not diaphoretic. No erythema.  Psychiatric: She has a normal mood and affect.  Vitals reviewed.    ED Treatments / Results  Labs (all labs ordered are listed, but only abnormal results are displayed) Labs Reviewed  CBC WITH DIFFERENTIAL/PLATELET - Abnormal; Notable for the following:       Result Value   Hemoglobin 10.0 (*)    HCT 32.1 (*)    MCH 24.8 (*)    Platelets 455 (*)    All other components within normal limits  CSF CELL COUNT WITH DIFFERENTIAL - Abnormal; Notable for the following:    RBC Count, CSF 120 (*)    All other components within normal limits  CSF CELL COUNT WITH DIFFERENTIAL - Abnormal; Notable for the following:    RBC Count, CSF 1 (*)    All other components within normal limits  CSF CULTURE  BASIC METABOLIC PANEL  PROTEIN AND GLUCOSE, CSF  I-STAT BETA HCG BLOOD, ED ([facility], [facility], [facility] ONLY)  POC URINE PREG, ED    EKG  EKG Interpretation None       Radiology Ct Head Wo Contrast  Result Date: 07/15/2016 CLINICAL DATA:  Suspected papilledema.  Headache and visual  loss. EXAM: CT HEAD WITHOUT CONTRAST TECHNIQUE: Contiguous axial images were obtained from the base of the skull through the vertex without intravenous contrast. COMPARISON:  None. FINDINGS: Brain: No evidence of acute infarction, hemorrhage, hydrocephalus, extra-axial collection or mass lesion/mass effect. Normal cerebral volume. No white matter disease. Vascular: No hyperdense vessel or unexpected calcification. Skull: Normal. Negative for fracture or focal lesion. Sinuses/Orbits: No acute finding. Other: None. IMPRESSION: Negative exam. There is no evidence for intracranial mass lesion or hydrocephalus. The normal CT head findings do not exclude the diagnosis of idiopathic intracranial hypertension. Electronically Signed   By: Elsie StainJohn T Curnes M.D.   On: 07/15/2016 17:49   Mr Laqueta JeanBrain W And Wo Contrast  Result Date: 07/15/2016 CLINICAL DATA:  Headache with papilledema.  Visual difficulty. EXAM: MRI HEAD WITHOUT AND WITH CONTRAST MRV HEAD WITHOUT CONTRAST TECHNIQUE: Multiplanar, multiecho pulse sequences of the brain and surrounding structures were obtained without and with intravenous contrast.  Angiographic images of the intracranial venous structures were obtained using MRV technique without intravenous contrast. CONTRAST:  13mL MULTIHANCE GADOBENATE DIMEGLUMINE 529 MG/ML IV SOLN COMPARISON:  CT head earlier today FINDINGS: MRI BRAIN WITHOUT AND WITH CONTRAST: Brain: No evidence for acute stroke, acute hemorrhage, mass lesion, hydrocephalus, or extra-axial fluid. Mild tonsillar ectopia, 4 mm, is consistent with idiopathic intracranial hypertension. Post infusion, no abnormal enhancement of the brain or meninges. Vascular:  Normal flow voids. Skull and skull base: Normal marrow signal. Early findings of partial empty sella. Sinuses and orbits: Paranasal sinuses are clear. Papilledema is identified, with flattening of the optic papillae, and dilated optic nerve sheaths bilaterally. Other:  None. MRV INTRACRANIAL  CIRCULATION: The LEFT transverse sinus is dominant. At the junction of the LEFT transverse and sigmoid sinuses, there is focal narrowing suggesting venous sinus stenosis. No intraluminal filling defects are seen. The RIGHT transverse sinus is diffusely narrowed throughout much of its course. The RIGHT sigmoid sinus is diminutive. No intraluminal filling defects are seen. Widely patent superior sagittal sinus. Internal cerebral veins, vein of Galen, and straight sinus are unremarkable. Dominant LEFT internal jugular vein. No cortical venous thrombosis. Hypertrophied RIGHT vein of Labbe could represent collateral venous drainage. IMPRESSION: No intracranial mass lesion or abnormal postcontrast enhancement. Constellation of findings consistent with idiopathic intracranial hypertension (IIH), including mild tonsillar ectopia, dilated optic nerve sheaths, papilledema, and partial empty sella. Abnormal MRV of the intracranial circulation suggesting BILATERAL venous sinus stenosis, most notably affecting the RIGHT transverse sinus. No signs of acute venous sinus thrombosis. Electronically Signed   By: Elsie Stain M.D.   On: 07/15/2016 21:38   Mr Mrv Almyra Deforest ZO Cm  Result Date: 07/15/2016 CLINICAL DATA:  Headache with papilledema.  Visual difficulty. EXAM: MRI HEAD WITHOUT AND WITH CONTRAST MRV HEAD WITHOUT CONTRAST TECHNIQUE: Multiplanar, multiecho pulse sequences of the brain and surrounding structures were obtained without and with intravenous contrast. Angiographic images of the intracranial venous structures were obtained using MRV technique without intravenous contrast. CONTRAST:  13mL MULTIHANCE GADOBENATE DIMEGLUMINE 529 MG/ML IV SOLN COMPARISON:  CT head earlier today FINDINGS: MRI BRAIN WITHOUT AND WITH CONTRAST: Brain: No evidence for acute stroke, acute hemorrhage, mass lesion, hydrocephalus, or extra-axial fluid. Mild tonsillar ectopia, 4 mm, is consistent with idiopathic intracranial hypertension. Post  infusion, no abnormal enhancement of the brain or meninges. Vascular:  Normal flow voids. Skull and skull base: Normal marrow signal. Early findings of partial empty sella. Sinuses and orbits: Paranasal sinuses are clear. Papilledema is identified, with flattening of the optic papillae, and dilated optic nerve sheaths bilaterally. Other:  None. MRV INTRACRANIAL CIRCULATION: The LEFT transverse sinus is dominant. At the junction of the LEFT transverse and sigmoid sinuses, there is focal narrowing suggesting venous sinus stenosis. No intraluminal filling defects are seen. The RIGHT transverse sinus is diffusely narrowed throughout much of its course. The RIGHT sigmoid sinus is diminutive. No intraluminal filling defects are seen. Widely patent superior sagittal sinus. Internal cerebral veins, vein of Galen, and straight sinus are unremarkable. Dominant LEFT internal jugular vein. No cortical venous thrombosis. Hypertrophied RIGHT vein of Labbe could represent collateral venous drainage. IMPRESSION: No intracranial mass lesion or abnormal postcontrast enhancement. Constellation of findings consistent with idiopathic intracranial hypertension (IIH), including mild tonsillar ectopia, dilated optic nerve sheaths, papilledema, and partial empty sella. Abnormal MRV of the intracranial circulation suggesting BILATERAL venous sinus stenosis, most notably affecting the RIGHT transverse sinus. No signs of acute venous sinus thrombosis. Electronically Signed   By: Jonny Ruiz  Abelino Derrick M.D.   On: 07/15/2016 21:38    Procedures .Lumbar Puncture Date/Time: 07/15/2016 7:39 PM Performed by: Nira Conn Authorized by: Nira Conn   Consent:    Consent obtained:  Written   Consent given by:  Patient   Risks discussed:  Bleeding, headache, infection, pain and repeat procedure   Alternatives discussed:  No treatment Pre-procedure details:    Procedure purpose:  Diagnostic   Preparation: Patient was prepped  and draped in usual sterile fashion   Anesthesia (see MAR for exact dosages):    Anesthesia method:  Local infiltration   Local anesthetic:  Lidocaine 1% WITH epi Procedure details:    Lumbar space:  L3-L4 interspace   Patient position:  L lateral decubitus   Needle gauge:  18   Needle type:  Spinal needle - Quincke tip   Needle length (in):  3.5   Number of attempts:  2   Opening pressure (cm H2O):  51   Closing pressure (cm H2O):  19   Fluid appearance:  Blood-tinged then clearing   Tubes of fluid:  4   Total volume (ml): 6. Post-procedure:    Puncture site:  Adhesive bandage applied   Patient tolerance of procedure:  Tolerated well, no immediate complications   (including critical care time)  Medications Ordered in ED Medications  lidocaine-EPINEPHrine (XYLOCAINE W/EPI) 2 %-1:100000 (with pres) injection 20 mL (not administered)  gadobenate dimeglumine (MULTIHANCE) injection 13 mL (13 mLs Intravenous Contrast Given 07/15/16 2110)     Initial Impression / Assessment and Plan / ED Course  I have reviewed the triage vital signs and the nursing notes.  Pertinent labs & imaging results that were available during my care of the patient were reviewed by me and considered in my medical decision making (see chart for details).     Presentation consistent with idiopathic intracranial hypertension. CT head and MRIs of the brain were negative for neoplasm or venous sinus thrombosis. Complete resolution of patient's headache following the lumbar puncture. Discussed case with neurology who recommended starting the patient on Diamox 500 mg twice a day. Patient will require close follow-up with ophthalmology and neurology.  Safe for discharge with strict return precautions.  Final Clinical Impressions(s) / ED Diagnoses   Final diagnoses:  Headache  IIH (idiopathic intracranial hypertension)   Disposition: Discharge  Condition: Good  I have discussed the results, Dx and Tx plan with  the patient who expressed understanding and agree(s) with the plan. Discharge instructions discussed at great length. The patient was given strict return precautions who verbalized understanding of the instructions. No further questions at time of discharge.    New Prescriptions   ACETAZOLAMIDE (DIAMOX SEQUELS) 500 MG CAPSULE    Take 1 capsule (500 mg total) by mouth 2 (two) times daily.    Follow Up: Ophthalmology  Schedule an appointment as soon as possible for a visit in 3 days   [facility] [address] [place], [address] [place] [address] [phone] Schedule an appointment as soon as possible for a visit in 1 week For close follow up for close management of idiopathic intracranial hypertension      Cardama, Amadeo Garnet, MD 07/15/16 2234

## 2016-07-15 NOTE — ED Notes (Signed)
Delay in vitals Pt in MRI

## 2016-07-15 NOTE — ED Triage Notes (Signed)
[facility] after being diagnosed with pressure on her optic nerve.  Patient endorses pain and blurry vision in left eye that started around a week ago.  Doctor referred her to come to ED. Note from physician states "exam shows significant nerve head edema and hemorrhages OU.".  Also states they recommend MRI and lumbar puncture to rule out mass lesion.

## 2016-07-15 NOTE — ED Notes (Signed)
Pt reports blurred vision in her L eye and h/a x 1 week, came to the ED thinking it was migraine and was discharged, she was told the the blurred vision will resolved over time.  Pt reports the h/a and blurred vision never resolved, so she went to see her optometrist and was referred to a "specialist" and was found to have pressure on her optic nerve.  Pt is A&Ox 4.  No obvious deformity noted.

## 2016-07-19 LAB — CSF CULTURE W GRAM STAIN
Culture: NO GROWTH
Gram Stain: NONE SEEN
Special Requests: NORMAL

## 2016-07-29 ENCOUNTER — Encounter: Payer: Self-pay | Admitting: Neurology

## 2016-10-16 ENCOUNTER — Ambulatory Visit: Payer: Self-pay | Admitting: Neurology

## 2018-11-03 IMAGING — CT CT HEAD W/O CM
3 of 4 series · 15 of 47 positions shown, 18 images · non-contrast
Comparison: None.

CLINICAL DATA: Suspected papilledema.  Headache and visual loss.

EXAM:
CT HEAD WITHOUT CONTRAST
TECHNIQUE: Contiguous axial images were obtained from the base of the skull
through the vertex without intravenous contrast.

[Series 2: head w/o · axial · non-contrast · 0.45mm/px · z∈[-63,+47]mm · 9 of 26 slices shown, 12 images]
[im 2/26  brain]
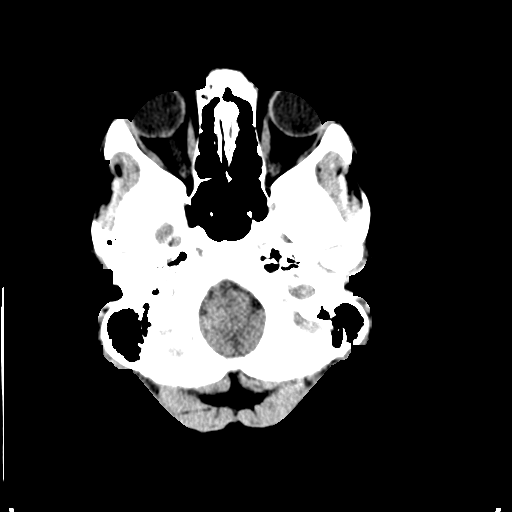
[im 2/26  bone]
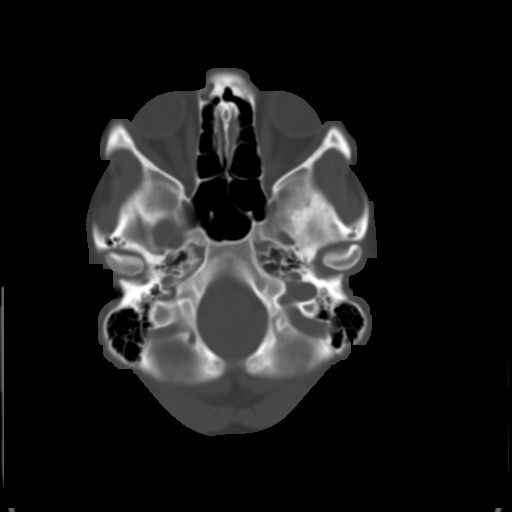
[im 6/26  brain]
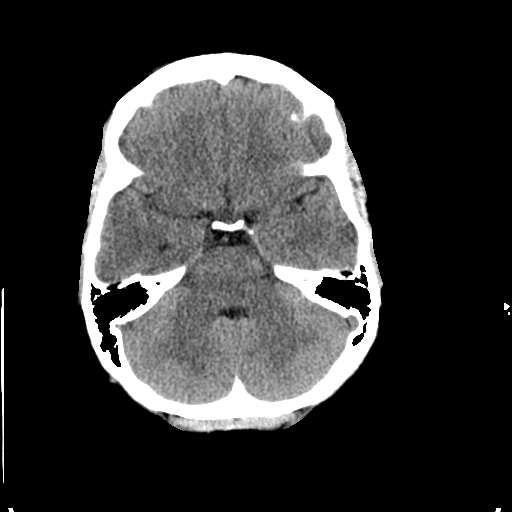
[im 8/26  brain]
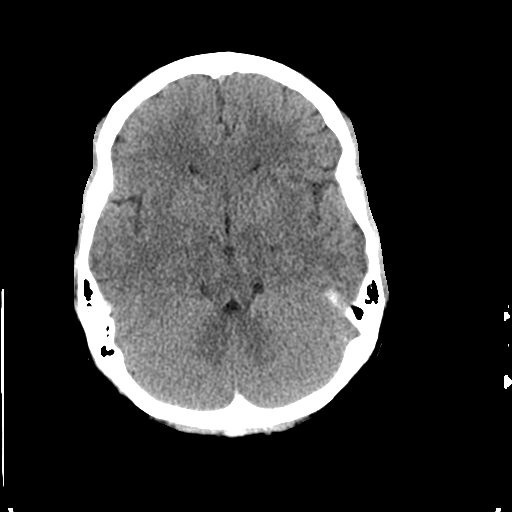
[im 11/26  brain]
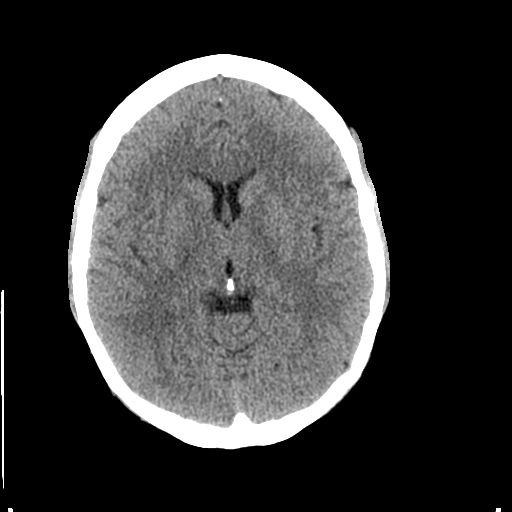
[im 13/26  brain]
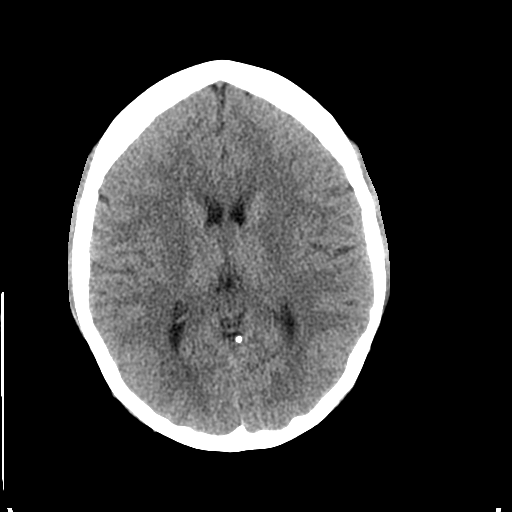
[im 13/26  bone]
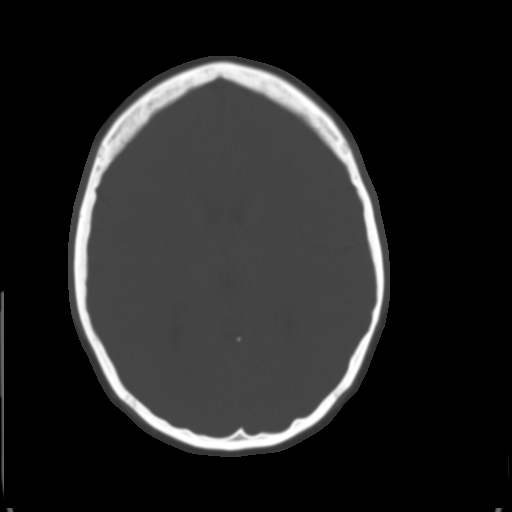
[im 15/26  brain]
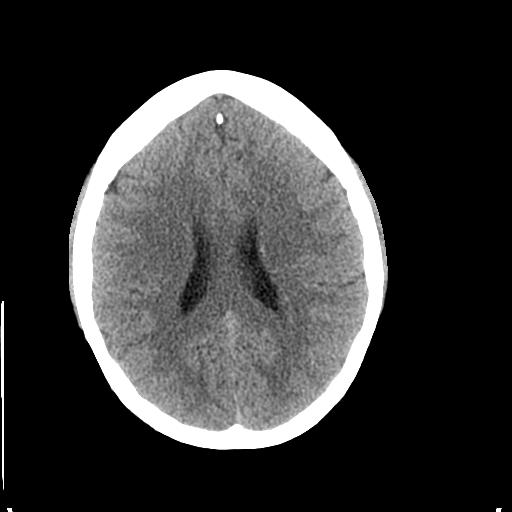
[im 18/26  brain]
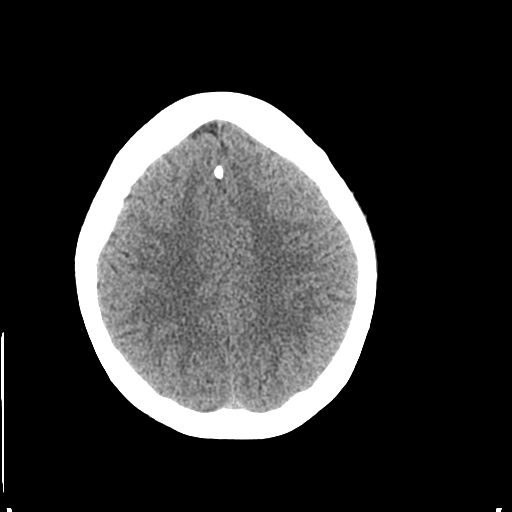
[im 20/26  brain]
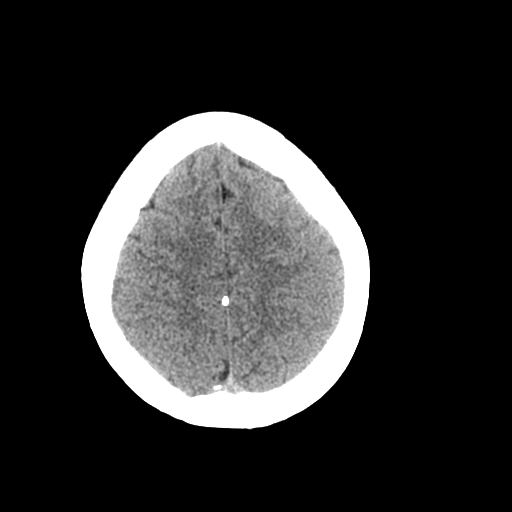
[im 24/26  brain]
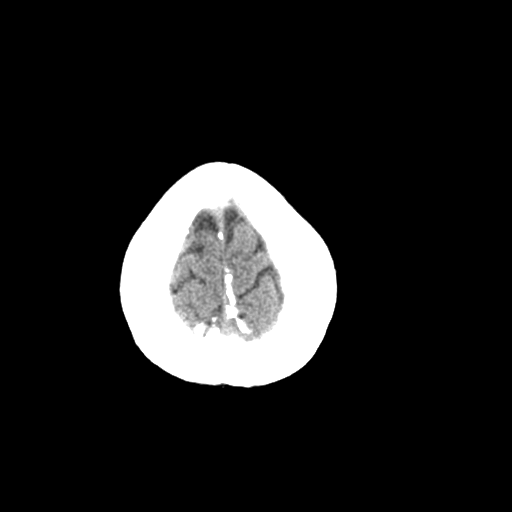
[im 24/26  bone]
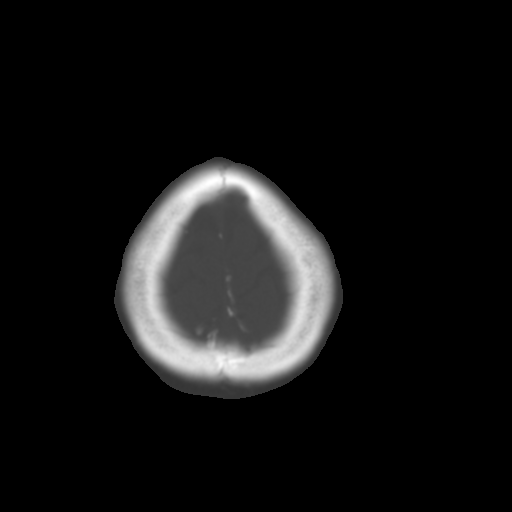

[Series 4: coronal · coronal · 0.27mm/px · 3 of 62 slices shown]
[im 21/62  brain]
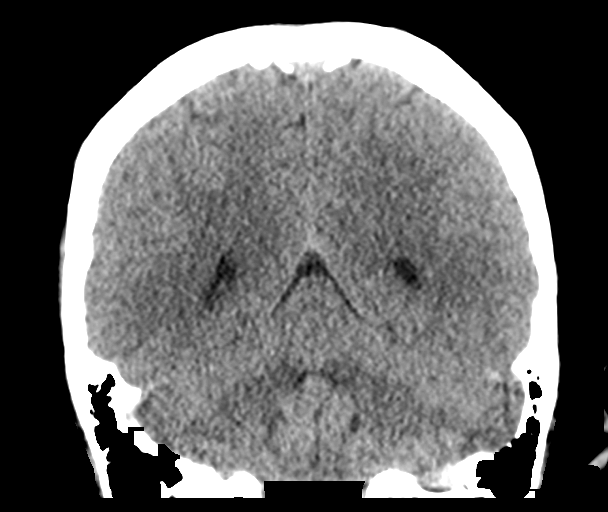
[im 28/62  brain]
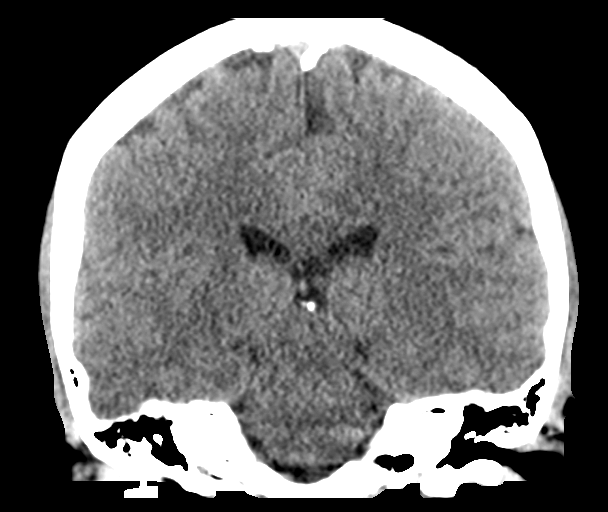
[im 34/62  brain]
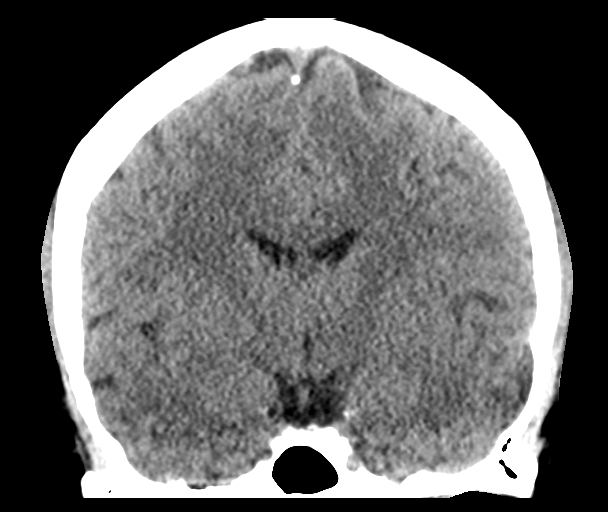

[Series 5: sagittal · sagittal · 0.27mm/px · 3 of 53 slices shown]
[im 18/53  brain]
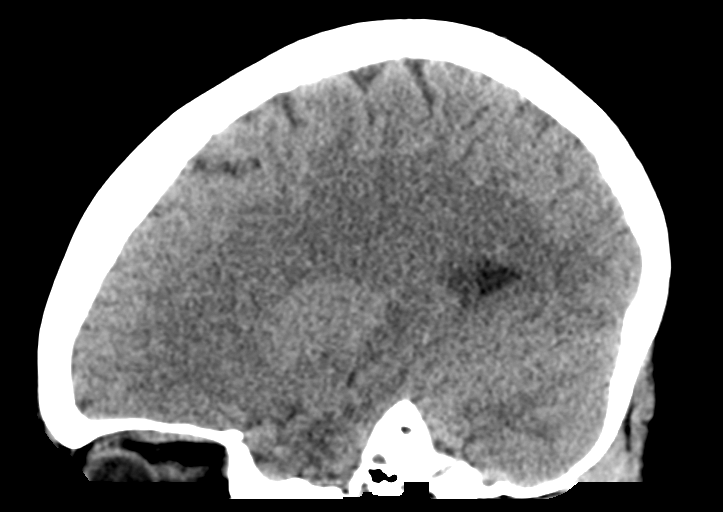
[im 27/53  brain]
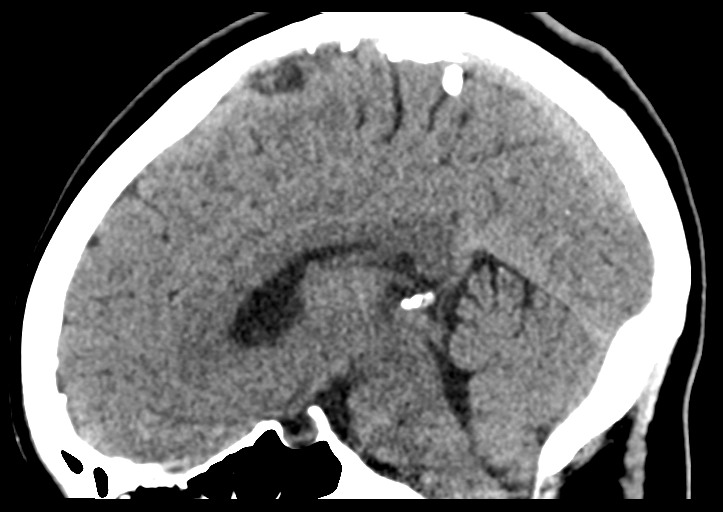
[im 35/53  brain]
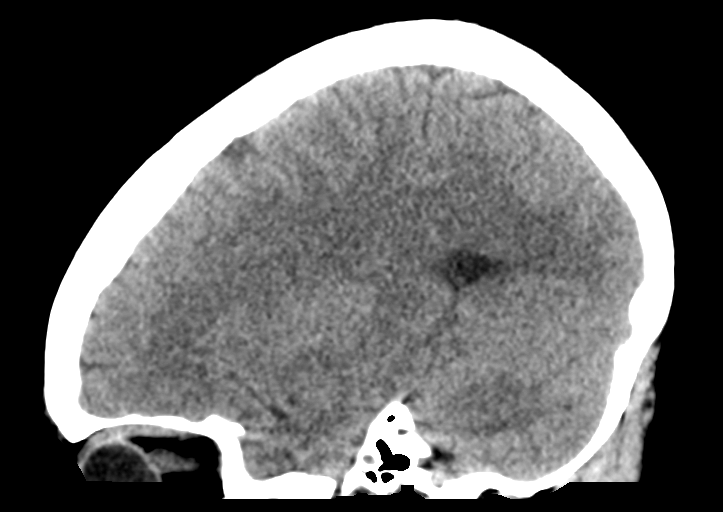

[15 of 47 positions shown; findings below may reference images not displayed]

FINDINGS: Brain: No evidence of acute infarction, hemorrhage, hydrocephalus,
extra-axial collection or mass lesion/mass effect. Normal cerebral
volume. No white matter disease.

Vascular: No hyperdense vessel or unexpected calcification.

Skull: Normal. Negative for fracture or focal lesion.

Sinuses/Orbits: No acute finding.

Other: None.
IMPRESSION: Negative exam.

There is no evidence for intracranial mass lesion or hydrocephalus.

The normal CT head findings do not exclude the diagnosis of
idiopathic intracranial hypertension.
